# Patient Record
Sex: Female | Born: 1955 | Race: White | Hispanic: No | Marital: Married | Smoking: Current every day smoker
Health system: Southern US, Community
[De-identification: ages and names within clinical notes are randomized; demographics above are authoritative.]

## PROBLEM LIST (undated history)

## (undated) HISTORY — PX: APPENDECTOMY: SHX54

---

## 2018-04-24 ENCOUNTER — Other Ambulatory Visit: Payer: Self-pay

## 2018-04-24 ENCOUNTER — Encounter (HOSPITAL_BASED_OUTPATIENT_CLINIC_OR_DEPARTMENT_OTHER): Payer: Self-pay

## 2018-04-24 DIAGNOSIS — R1031 Right lower quadrant pain: Secondary | ICD-10-CM | POA: Insufficient documentation

## 2018-04-24 DIAGNOSIS — F17200 Nicotine dependence, unspecified, uncomplicated: Secondary | ICD-10-CM | POA: Diagnosis not present

## 2018-04-24 DIAGNOSIS — M25551 Pain in right hip: Secondary | ICD-10-CM | POA: Diagnosis not present

## 2018-04-24 DIAGNOSIS — M25522 Pain in left elbow: Secondary | ICD-10-CM | POA: Insufficient documentation

## 2018-04-24 DIAGNOSIS — R079 Chest pain, unspecified: Secondary | ICD-10-CM | POA: Diagnosis not present

## 2018-04-24 DIAGNOSIS — M25561 Pain in right knee: Secondary | ICD-10-CM | POA: Insufficient documentation

## 2018-04-24 DIAGNOSIS — M546 Pain in thoracic spine: Secondary | ICD-10-CM | POA: Insufficient documentation

## 2018-04-24 DIAGNOSIS — R51 Headache: Secondary | ICD-10-CM | POA: Diagnosis not present

## 2018-04-24 DIAGNOSIS — M79671 Pain in right foot: Secondary | ICD-10-CM | POA: Diagnosis not present

## 2018-04-24 DIAGNOSIS — M542 Cervicalgia: Secondary | ICD-10-CM | POA: Diagnosis not present
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
## 2018-04-25 ENCOUNTER — Emergency Department (HOSPITAL_BASED_OUTPATIENT_CLINIC_OR_DEPARTMENT_OTHER)

## 2018-04-25 ENCOUNTER — Encounter (HOSPITAL_BASED_OUTPATIENT_CLINIC_OR_DEPARTMENT_OTHER): Payer: Self-pay | Admitting: Emergency Medicine

## 2018-04-25 ENCOUNTER — Emergency Department (HOSPITAL_BASED_OUTPATIENT_CLINIC_OR_DEPARTMENT_OTHER)
Admission: EM | Admit: 2018-04-25 | Discharge: 2018-04-25 | Disposition: A | Discharge status: Home / Self Care | Attending: Emergency Medicine | Admitting: Emergency Medicine

## 2018-04-25 MED ORDER — NAPROXEN 375 MG PO TABS: 375.0000 mg | ORAL_TABLET | Freq: Two times a day (BID) | ORAL | 0 refills | Status: AC

## 2018-04-25 MED ORDER — METHOCARBAMOL 500 MG PO TABS: 500.0000 mg | ORAL_TABLET | Freq: Two times a day (BID) | ORAL | 0 refills | Status: AC

## 2018-04-25 MED ORDER — NAPROXEN 250 MG PO TABS
500.0000 mg | ORAL_TABLET | Freq: Once | ORAL | Status: AC
  Administered 2018-04-25: 500 mg via ORAL
  Filled 2018-04-25: qty 2

## 2018-04-25 MED ORDER — METHOCARBAMOL 500 MG PO TABS
1000.0000 mg | ORAL_TABLET | Freq: Once | ORAL | Status: AC
  Administered 2018-04-25: 1000 mg via ORAL
  Filled 2018-04-25: qty 2

## 2018-04-25 MED ORDER — ACETAMINOPHEN 500 MG PO TABS
1000.0000 mg | ORAL_TABLET | Freq: Once | ORAL | Status: AC
  Administered 2018-04-25: 1000 mg via ORAL
  Filled 2018-04-25: qty 2
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
